# Patient Record
Sex: Female | Born: 1940 | Race: Black or African American | Hispanic: No
Health system: Southern US, Community
[De-identification: ages and names within clinical notes are randomized; demographics above are authoritative.]

---

## 2014-07-13 ENCOUNTER — Other Ambulatory Visit: Payer: Self-pay | Admitting: *Deleted

## 2014-07-13 ENCOUNTER — Ambulatory Visit
Admission: RE | Admit: 2014-07-13 | Discharge: 2014-07-13 | Disposition: A | Discharge status: Home / Self Care | Payer: No Typology Code available for payment source | Source: Ambulatory Visit | Attending: *Deleted | Admitting: *Deleted

## 2014-07-13 DIAGNOSIS — M25552 Pain in left hip: Secondary | ICD-10-CM

## 2015-12-30 IMAGING — CR DG HIP (WITH OR WITHOUT PELVIS) 2-3V*L*
2 series · 2 of 2 positions shown · non-contrast
Comparison: None.

CLINICAL DATA: Fall this morning. Left injury and pain. Initial
encounter.

EXAM:
DG HIP W/ PELVIS 2-3V*L*

[w pelvis (1 of 2)]
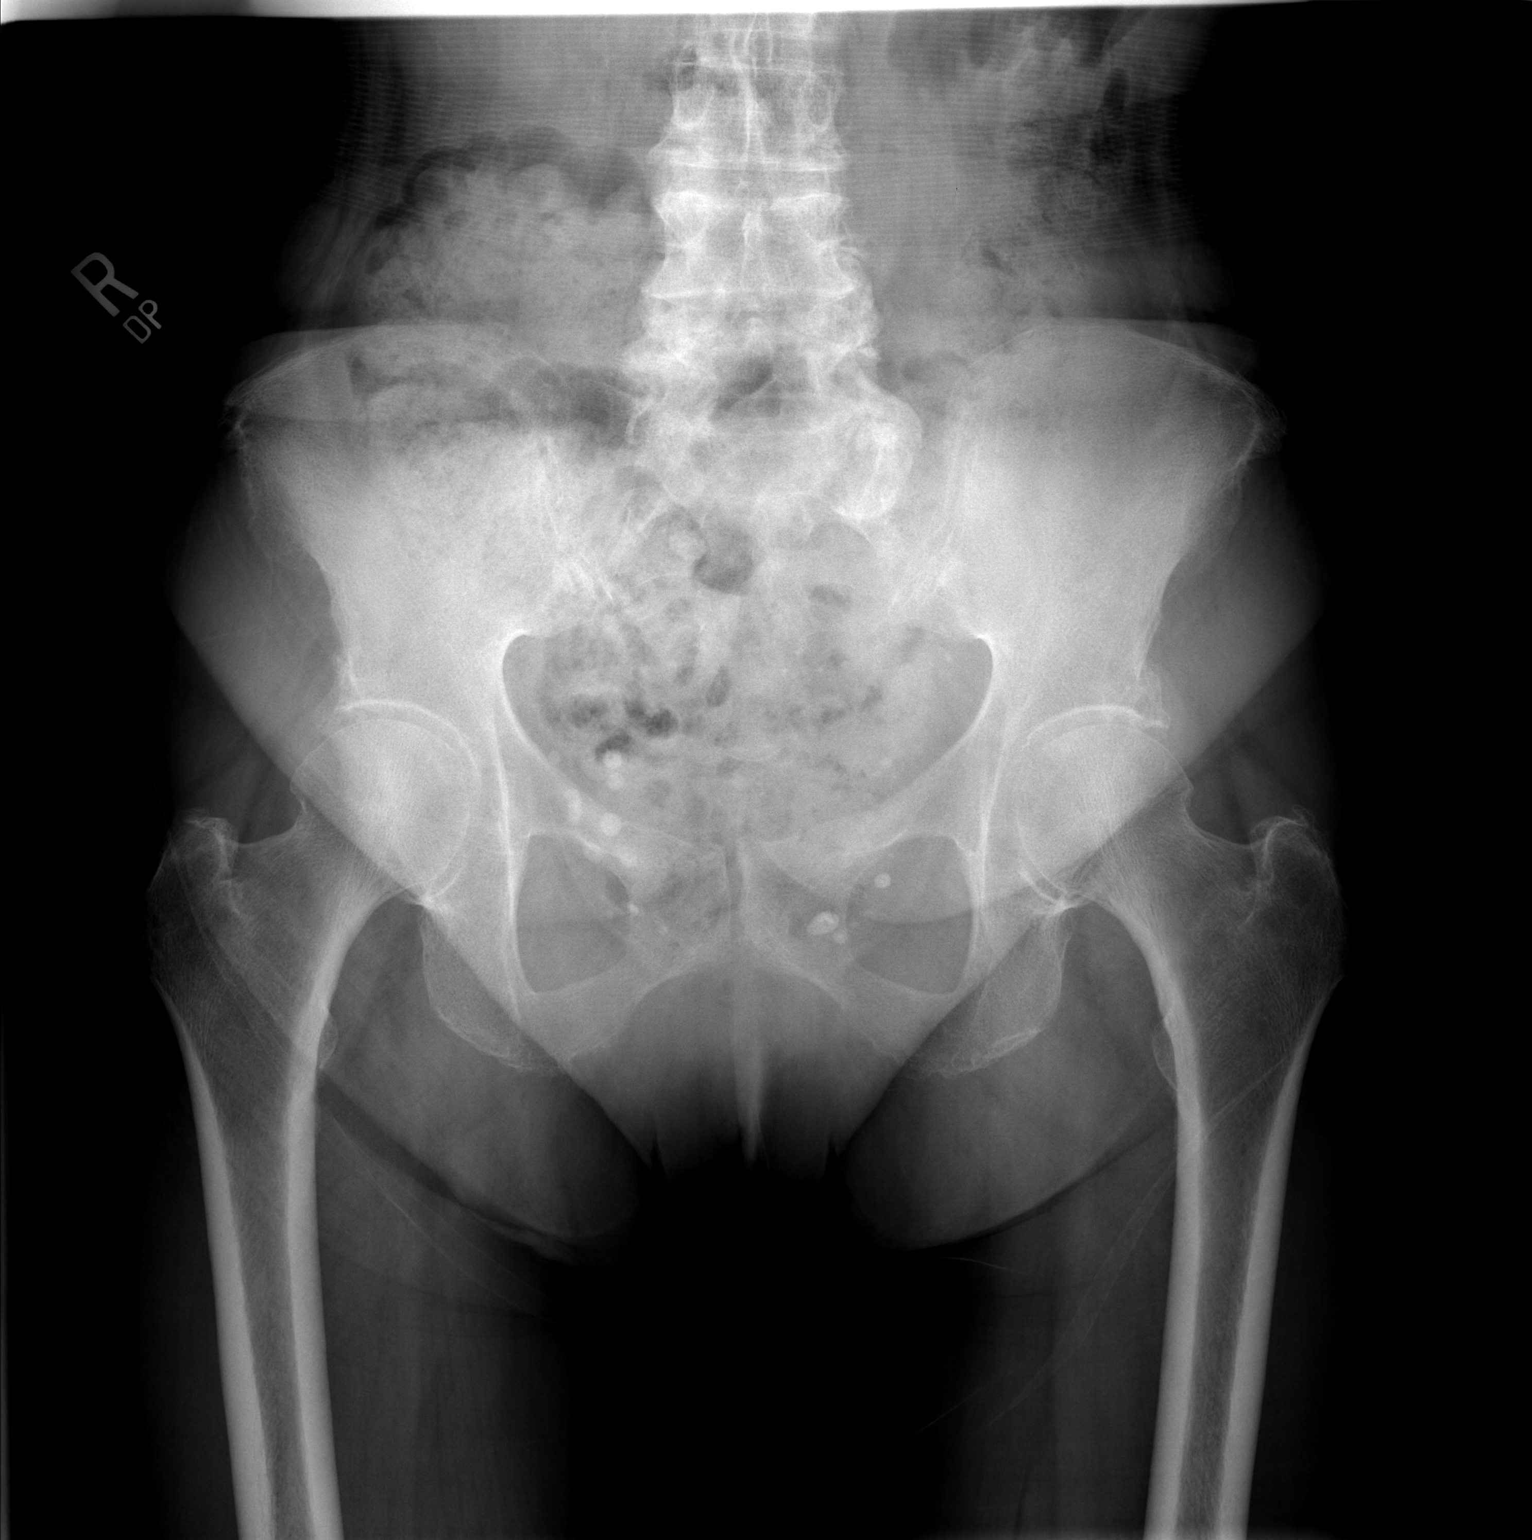

[w pelvis (2 of 2)]
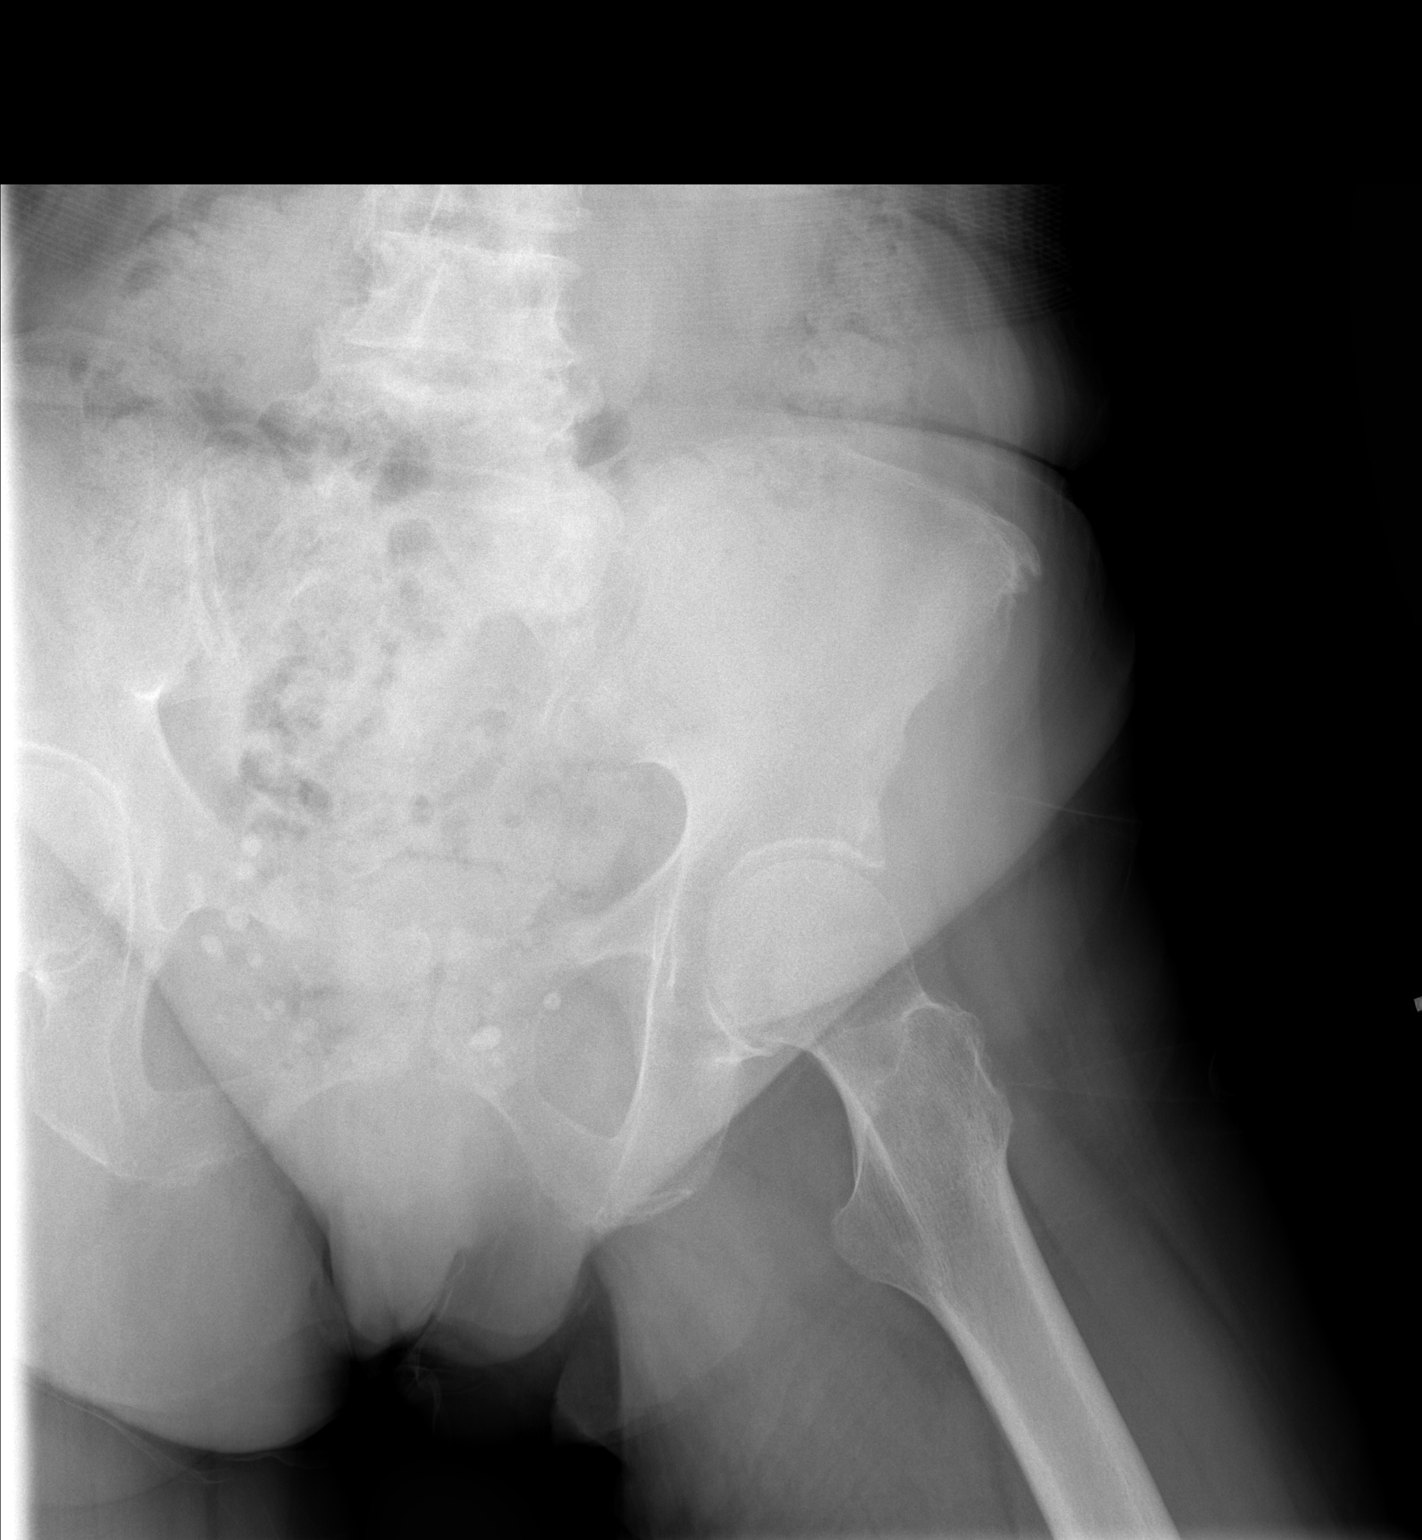

[2 of 2 positions shown; findings below may reference images not displayed]

FINDINGS: No evidence of left hip fracture or dislocation. No evidence of hip
joint arthropathy. No pelvic fractures are identified. Advanced
lower lumbar spine degenerative changes noted.
IMPRESSION: No evidence of acute fracture or dislocation.

Advanced lower lumbar spine degenerative changes.

## 2020-02-07 ENCOUNTER — Emergency Department (HOSPITAL_COMMUNITY): Payer: Medicare (Managed Care)

## 2020-02-07 ENCOUNTER — Emergency Department (HOSPITAL_COMMUNITY)
Admission: EM | Admit: 2020-02-07 | Discharge: 2020-02-07 | Disposition: A | Discharge status: Home / Self Care | Payer: Medicare (Managed Care) | Attending: Emergency Medicine | Admitting: Emergency Medicine

## 2020-02-07 DIAGNOSIS — Z79899 Other long term (current) drug therapy: Secondary | ICD-10-CM | POA: Diagnosis not present

## 2020-02-07 DIAGNOSIS — F0390 Unspecified dementia without behavioral disturbance: Secondary | ICD-10-CM | POA: Insufficient documentation

## 2020-02-07 DIAGNOSIS — R410 Disorientation, unspecified: Secondary | ICD-10-CM | POA: Insufficient documentation

## 2020-02-07 DIAGNOSIS — T675XXA Heat exhaustion, unspecified, initial encounter: Secondary | ICD-10-CM | POA: Diagnosis not present

## 2020-02-07 LAB — CBC WITH DIFFERENTIAL/PLATELET
Abs Immature Granulocytes: 0.02 10*3/uL (ref 0.00–0.07)
Basophils Absolute: 0 10*3/uL (ref 0.0–0.1)
Basophils Relative: 0 %
Eosinophils Absolute: 0 10*3/uL (ref 0.0–0.5)
Eosinophils Relative: 0 %
HCT: 35 % — ABNORMAL LOW (ref 36.0–46.0)
Hemoglobin: 10.7 g/dL — ABNORMAL LOW (ref 12.0–15.0)
Immature Granulocytes: 0 %
Lymphocytes Relative: 37 %
Lymphs Abs: 2.8 10*3/uL (ref 0.7–4.0)
MCH: 30.9 pg (ref 26.0–34.0)
MCHC: 30.6 g/dL (ref 30.0–36.0)
MCV: 101.2 fL — ABNORMAL HIGH (ref 80.0–100.0)
Monocytes Absolute: 0.5 10*3/uL (ref 0.1–1.0)
Monocytes Relative: 7 %
Neutro Abs: 4.2 10*3/uL (ref 1.7–7.7)
Neutrophils Relative %: 56 %
Platelets: 150 10*3/uL (ref 150–400)
RBC: 3.46 MIL/uL — ABNORMAL LOW (ref 3.87–5.11)
RDW: 14.1 % (ref 11.5–15.5)
WBC: 7.5 10*3/uL (ref 4.0–10.5)
nRBC: 0 % (ref 0.0–0.2)

## 2020-02-07 LAB — COMPREHENSIVE METABOLIC PANEL
ALT: 25 U/L (ref 0–44)
AST: 40 U/L (ref 15–41)
Albumin: 3.2 g/dL — ABNORMAL LOW (ref 3.5–5.0)
Alkaline Phosphatase: 72 U/L (ref 38–126)
Anion gap: 11 (ref 5–15)
BUN: 21 mg/dL (ref 8–23)
CO2: 22 mmol/L (ref 22–32)
Calcium: 11.3 mg/dL — ABNORMAL HIGH (ref 8.9–10.3)
Chloride: 116 mmol/L — ABNORMAL HIGH (ref 98–111)
Creatinine, Ser: 1.33 mg/dL — ABNORMAL HIGH (ref 0.44–1.00)
GFR calc Af Amer: 44 mL/min — ABNORMAL LOW (ref >60)
GFR calc non Af Amer: 38 mL/min — ABNORMAL LOW (ref >60)
Glucose, Bld: 112 mg/dL — ABNORMAL HIGH (ref 70–99)
Potassium: 4.4 mmol/L (ref 3.5–5.1)
Sodium: 149 mmol/L — ABNORMAL HIGH (ref 135–145)
Total Bilirubin: 0.9 mg/dL (ref 0.3–1.2)
Total Protein: 6.3 g/dL — ABNORMAL LOW (ref 6.5–8.1)

## 2020-02-07 LAB — CK: Total CK: 611 U/L — ABNORMAL HIGH (ref 38–234)

## 2020-02-07 LAB — URINALYSIS, ROUTINE W REFLEX MICROSCOPIC
Bilirubin Urine: NEGATIVE
Glucose, UA: NEGATIVE mg/dL
Hgb urine dipstick: NEGATIVE
Ketones, ur: NEGATIVE mg/dL
Leukocytes,Ua: NEGATIVE
Nitrite: NEGATIVE
Protein, ur: NEGATIVE mg/dL
Specific Gravity, Urine: 1.018 (ref 1.005–1.030)
pH: 5 (ref 5.0–8.0)

## 2020-02-07 MED ORDER — SODIUM CHLORIDE 0.9 % IV BOLUS
1000.0000 mL | Freq: Once | INTRAVENOUS | Status: AC
  Administered 2020-02-07: 1000 mL via INTRAVENOUS
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
## 2020-04-16 DEATH — deceased

## 2020-05-16 ENCOUNTER — Ambulatory Visit: Payer: No Typology Code available for payment source | Admitting: Podiatry

## 2021-07-26 IMAGING — DX DG CHEST 1V
1 series · 1 of 1 positions shown · non-contrast
Comparison: None.

CLINICAL DATA: He stroke

EXAM:
CHEST  1 VIEW

[chest]
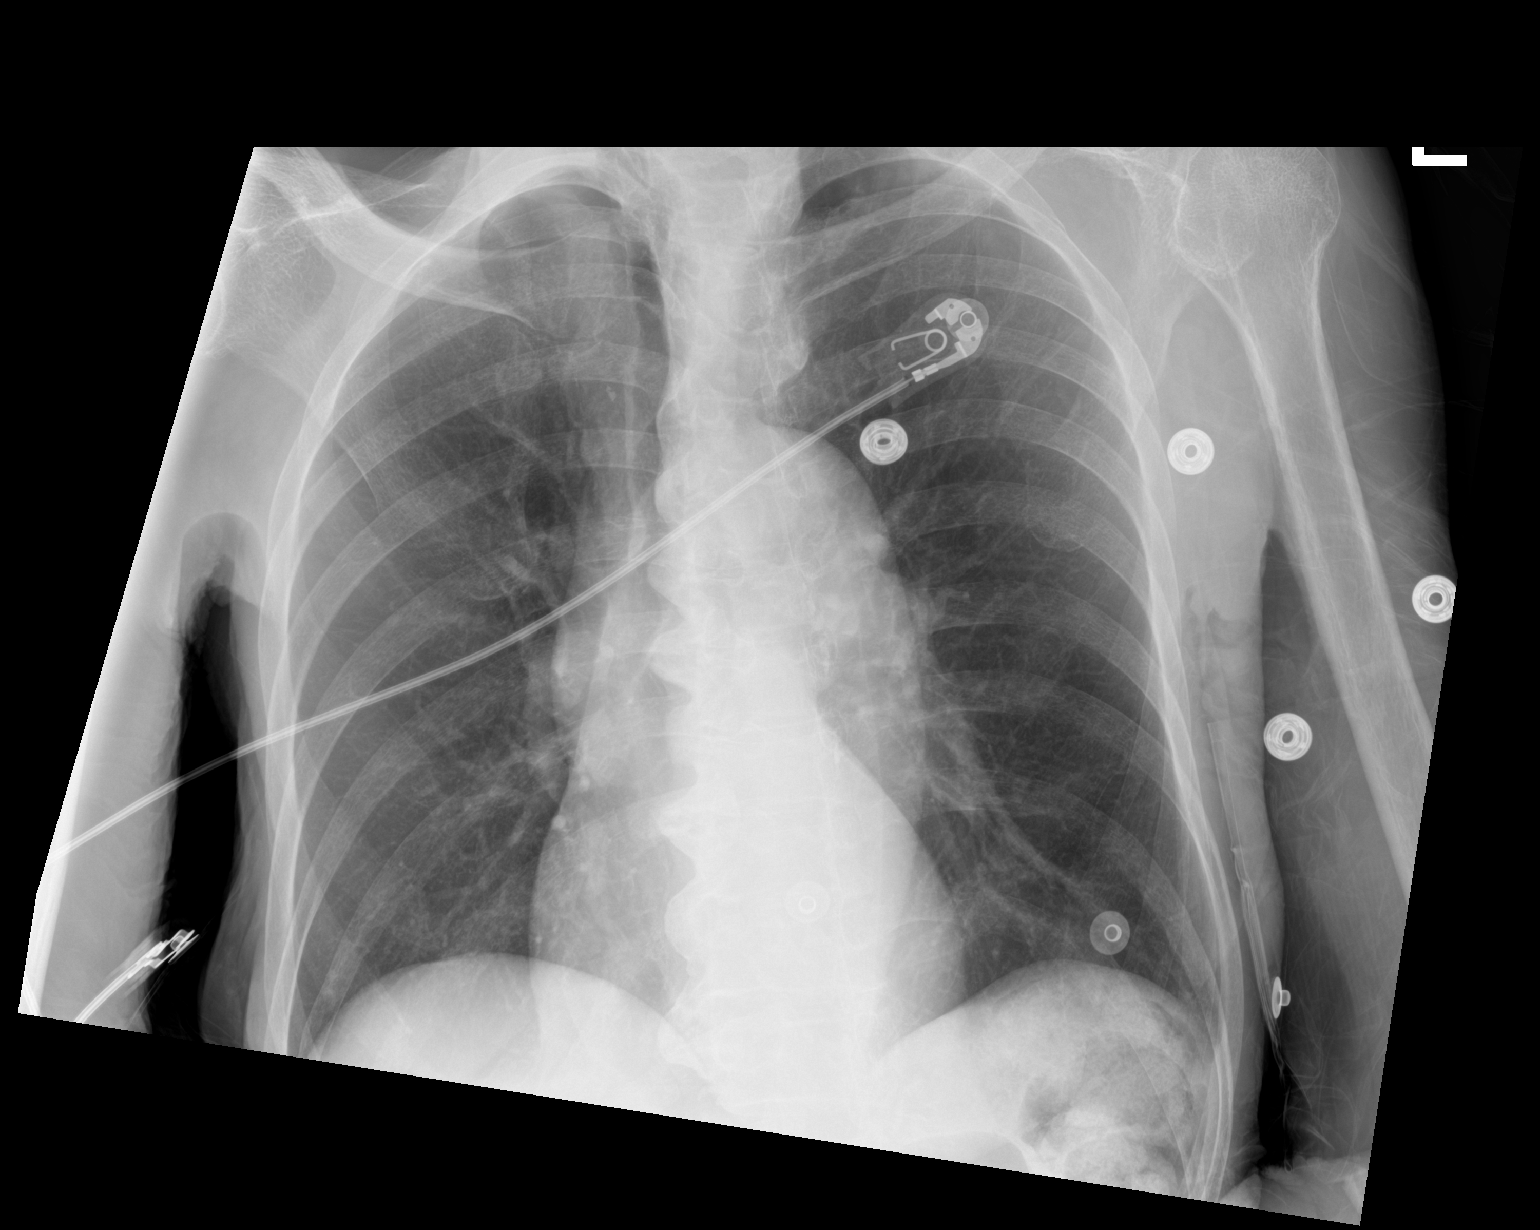

[1 of 1 positions shown; findings below may reference images not displayed]

FINDINGS: The heart size and mediastinal contours are within normal limits.
Both lungs are clear. The visualized skeletal structures show
degenerative change of the thoracic spine.
IMPRESSION: No active disease.
